# Patient Record
Sex: Male | Born: 1975 | Race: Black or African American | Hispanic: No | Marital: Single | Smoking: Never smoker
Health system: Southern US, Community
[De-identification: ages and names within clinical notes are randomized; demographics above are authoritative.]

## PROBLEM LIST (undated history)

## (undated) DIAGNOSIS — I10 Essential (primary) hypertension: Secondary | ICD-10-CM

## (undated) DIAGNOSIS — N289 Disorder of kidney and ureter, unspecified: Secondary | ICD-10-CM

## (undated) HISTORY — PX: KNEE ARTHROSCOPY: SUR90

---

## 2002-01-03 ENCOUNTER — Encounter: Payer: Self-pay | Admitting: *Deleted

## 2002-01-03 ENCOUNTER — Encounter: Admission: RE | Admit: 2002-01-03 | Discharge: 2002-01-03 | Payer: Self-pay | Admitting: *Deleted

## 2003-07-29 ENCOUNTER — Emergency Department (HOSPITAL_COMMUNITY): Admission: EM | Admit: 2003-07-29 | Discharge: 2003-07-29 | Payer: Self-pay | Admitting: Family Medicine

## 2004-03-12 ENCOUNTER — Observation Stay (HOSPITAL_COMMUNITY): Admission: AC | Admit: 2004-03-12 | Discharge: 2004-03-13 | Payer: Self-pay

## 2006-06-15 ENCOUNTER — Emergency Department (HOSPITAL_COMMUNITY): Admission: EM | Admit: 2006-06-15 | Discharge: 2006-06-15 | Payer: Self-pay | Admitting: Family Medicine

## 2008-07-05 ENCOUNTER — Emergency Department (HOSPITAL_COMMUNITY): Admission: EM | Admit: 2008-07-05 | Discharge: 2008-07-05 | Payer: Self-pay | Admitting: Emergency Medicine

## 2010-06-03 IMAGING — CR DG FINGER RING 2+V*L*
3 series · 3 of 3 positions shown · non-contrast
Comparison: None

CLINICAL DATA: Crush injury.

LEFT RING FINGER 2+V

[x finger pa left]
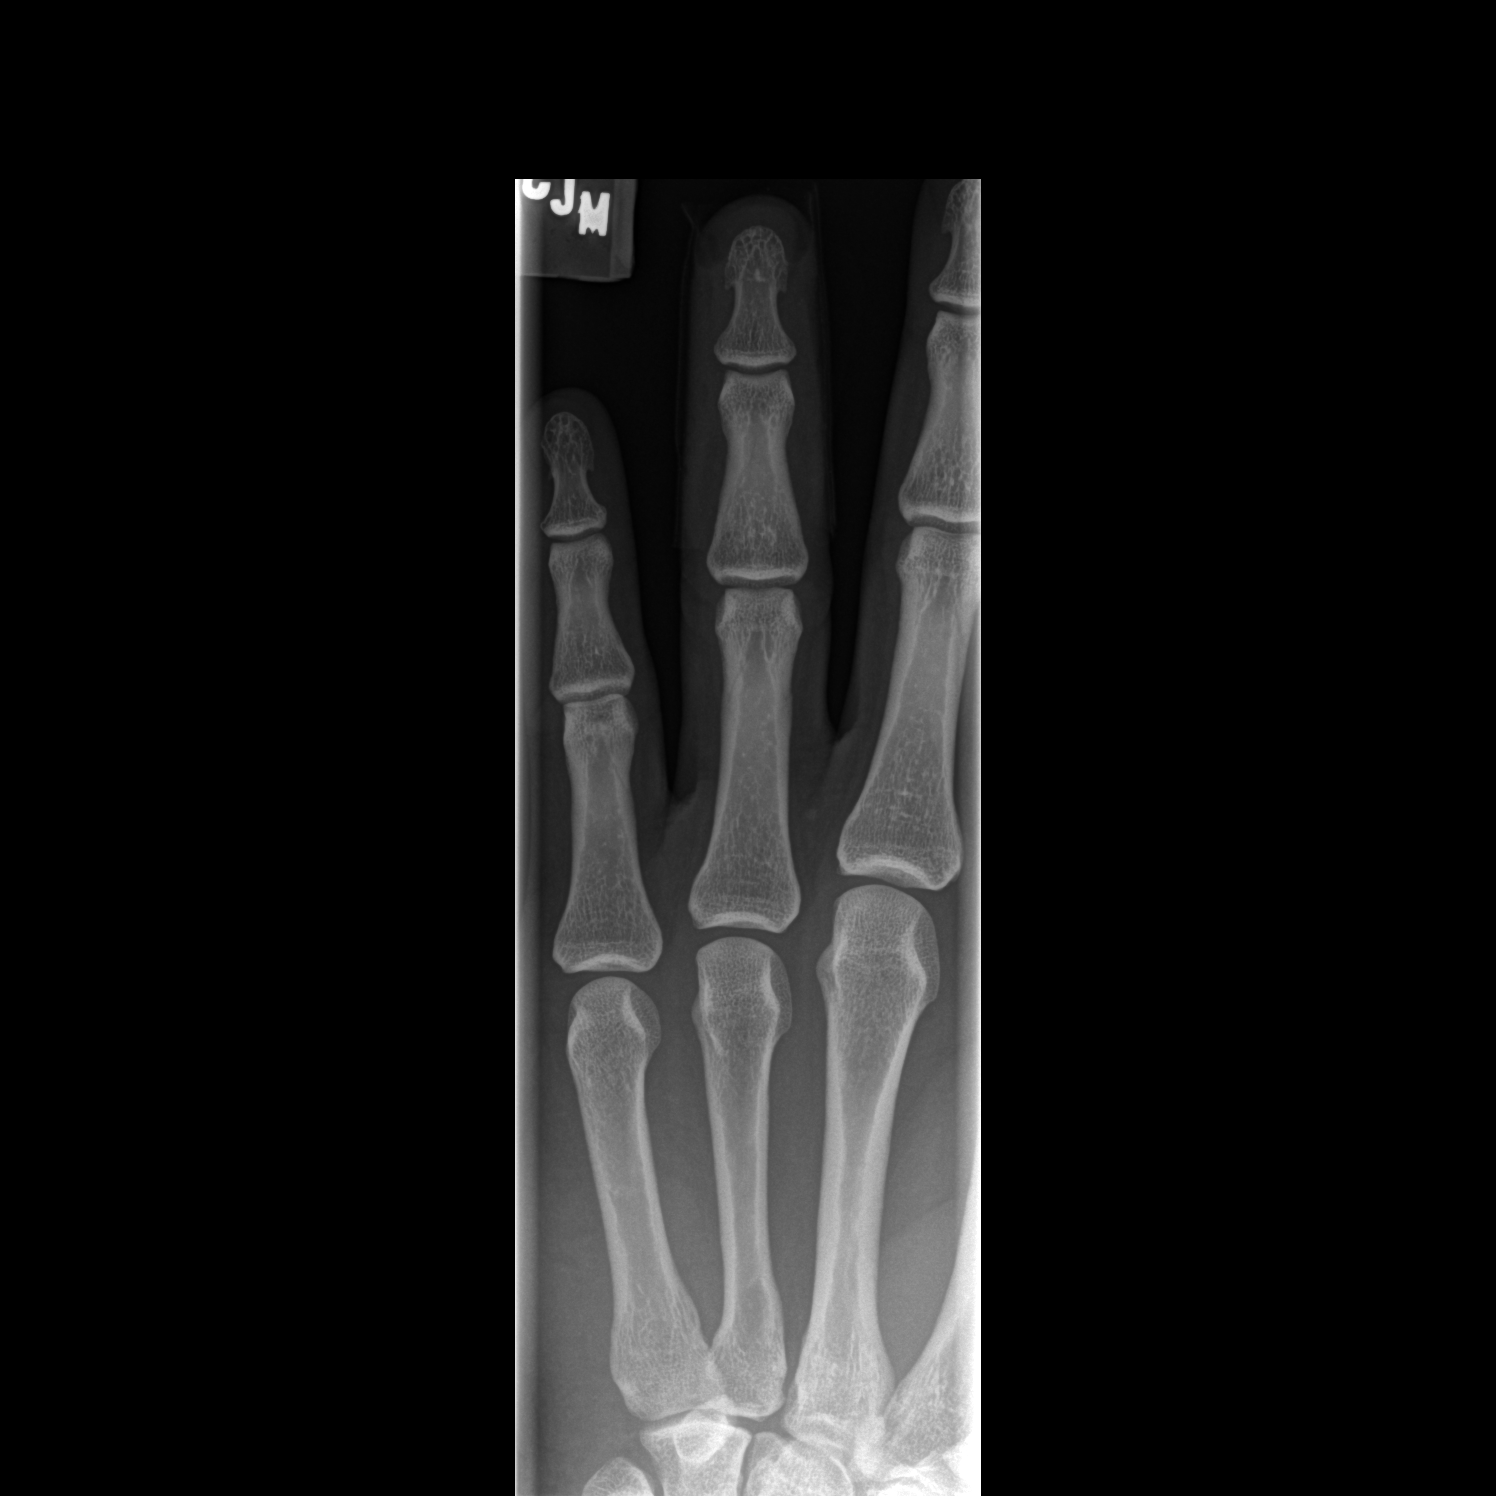

[x finger obl. left]
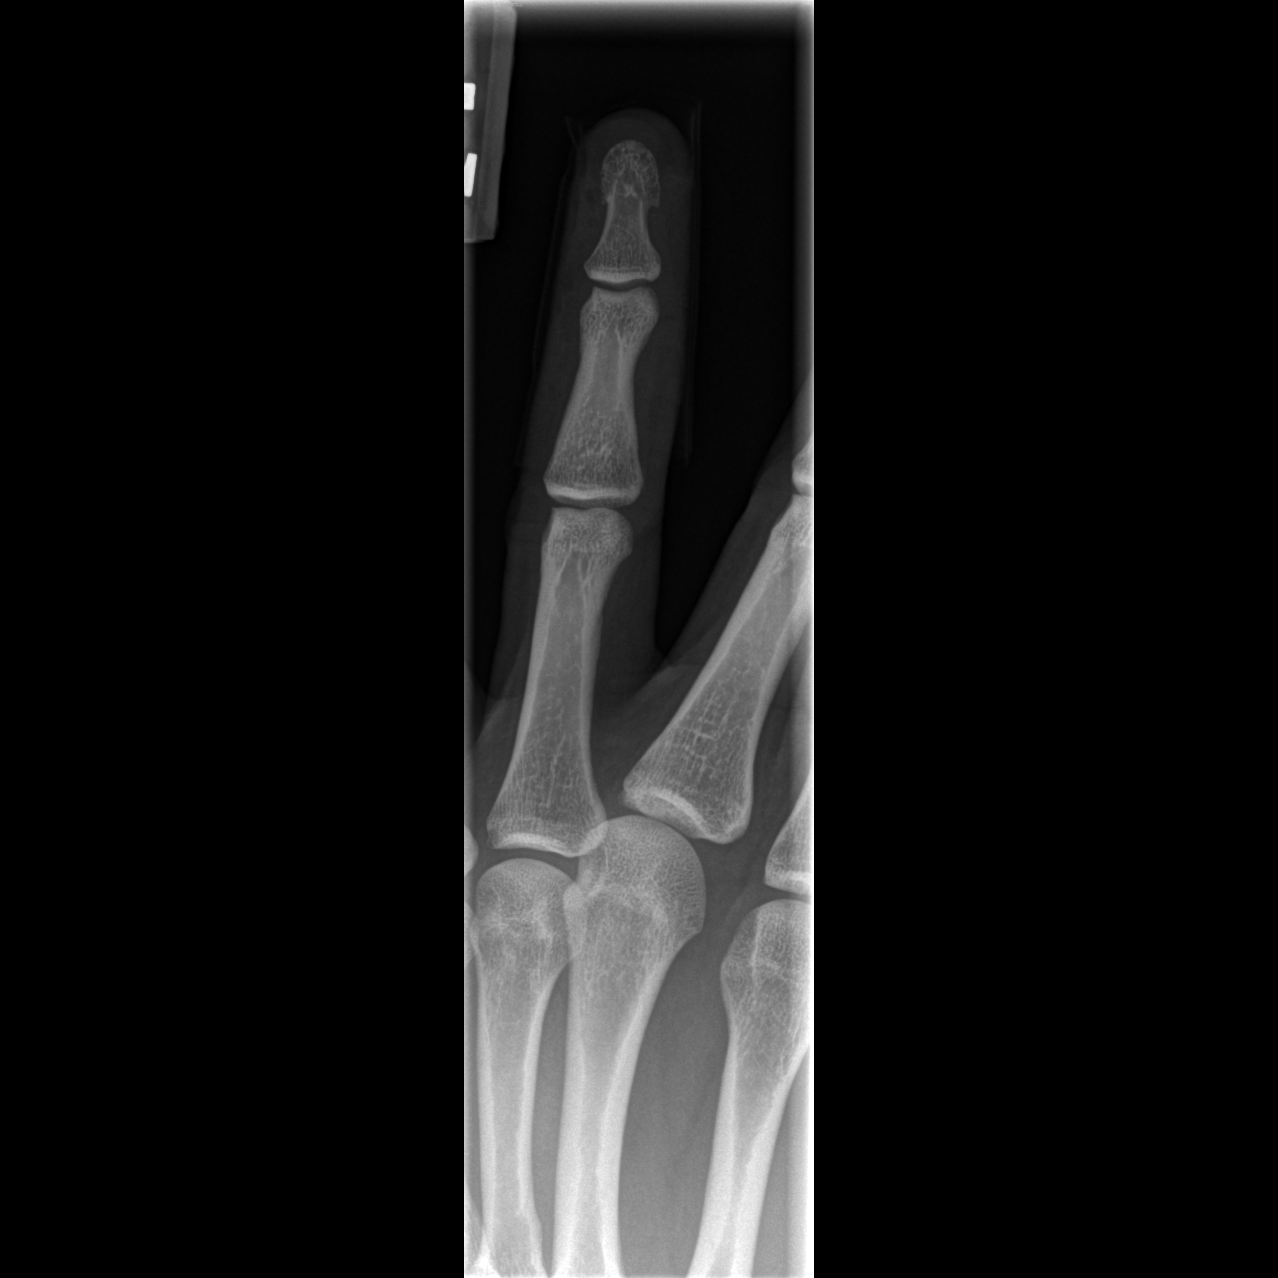

[x finger lateral left]
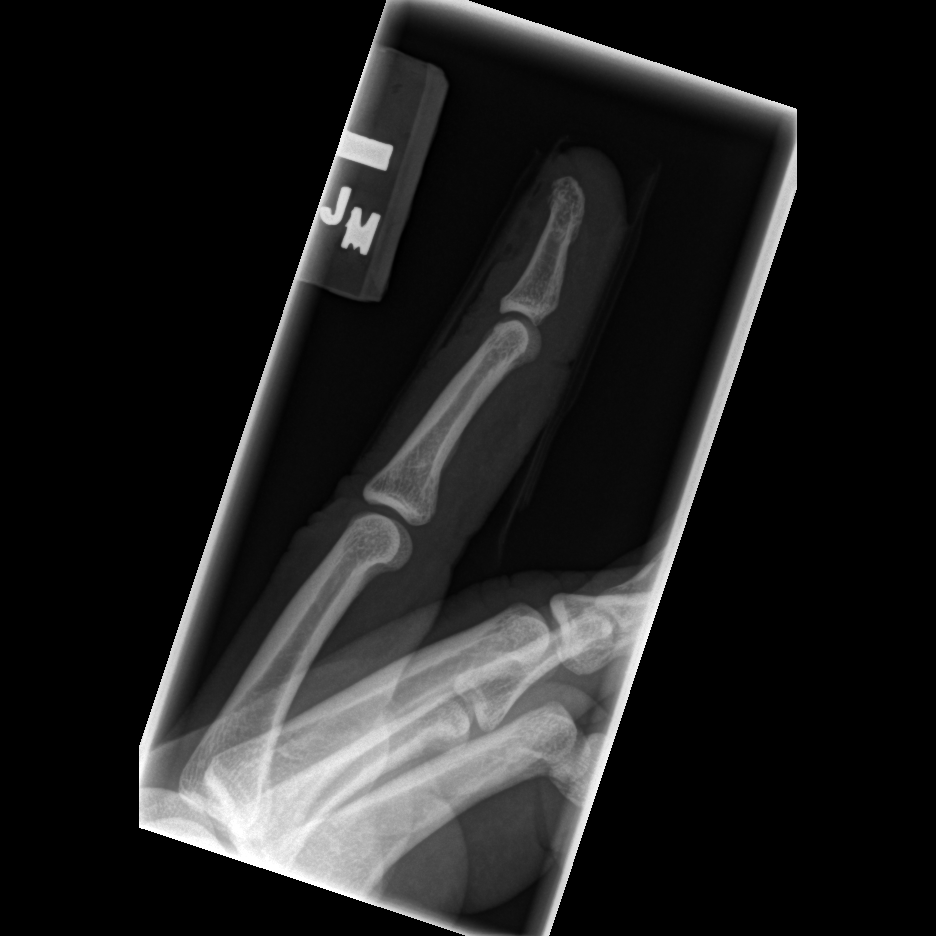

[3 of 3 positions shown; findings below may reference images not displayed]

FINDINGS: Negative for fracture or foreign body.  There is gas in
the soft tissues due to laceration.
IMPRESSION: Negative for fracture.
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
## 2015-10-15 ENCOUNTER — Encounter (HOSPITAL_COMMUNITY): Payer: Self-pay | Admitting: Adult Health

## 2015-10-15 DIAGNOSIS — R112 Nausea with vomiting, unspecified: Secondary | ICD-10-CM | POA: Diagnosis not present

## 2015-10-15 DIAGNOSIS — I10 Essential (primary) hypertension: Secondary | ICD-10-CM | POA: Diagnosis not present

## 2015-10-15 DIAGNOSIS — R42 Dizziness and giddiness: Secondary | ICD-10-CM | POA: Diagnosis not present

## 2015-10-15 DIAGNOSIS — Z79899 Other long term (current) drug therapy: Secondary | ICD-10-CM | POA: Insufficient documentation

## 2015-10-15 LAB — BASIC METABOLIC PANEL
ANION GAP: 13 (ref 5–15)
BUN: 9 mg/dL (ref 6–20)
CALCIUM: 9.3 mg/dL (ref 8.9–10.3)
CO2: 23 mmol/L (ref 22–32)
CREATININE: 1.4 mg/dL — AB (ref 0.61–1.24)
Chloride: 103 mmol/L (ref 101–111)
Glucose, Bld: 95 mg/dL (ref 65–99)
Potassium: 3.7 mmol/L (ref 3.5–5.1)
SODIUM: 139 mmol/L (ref 135–145)

## 2015-10-15 LAB — URINALYSIS, ROUTINE W REFLEX MICROSCOPIC
Bilirubin Urine: NEGATIVE
Glucose, UA: NEGATIVE mg/dL
HGB URINE DIPSTICK: NEGATIVE
Ketones, ur: NEGATIVE mg/dL
LEUKOCYTES UA: NEGATIVE
NITRITE: NEGATIVE
PROTEIN: NEGATIVE mg/dL
SPECIFIC GRAVITY, URINE: 1.017 (ref 1.005–1.030)
pH: 6 (ref 5.0–8.0)

## 2015-10-15 LAB — CBC
HCT: 43.4 % (ref 39.0–52.0)
HEMOGLOBIN: 14.8 g/dL (ref 13.0–17.0)
MCH: 27.6 pg (ref 26.0–34.0)
MCHC: 34.1 g/dL (ref 30.0–36.0)
MCV: 80.8 fL (ref 78.0–100.0)
PLATELETS: 338 10*3/uL (ref 150–400)
RBC: 5.37 MIL/uL (ref 4.22–5.81)
RDW: 14.2 % (ref 11.5–15.5)
WBC: 8.6 10*3/uL (ref 4.0–10.5)

## 2015-10-15 NOTE — ED Notes (Addendum)
Presents with episode of dizziness that began Thursday associated with nausea and emesis-"all of a sudden I feel weak and then I start to get dizzy. I try to get up and move but have to sit down because moving makes dizziness worse, then I vomit. Today at 8 pm began feeling light headed, denies dizziness now, endorses nausea. Denies pain. Pt is hypertensive. Denies headache.  Alert, oriented, denies numbness and tingling.

## 2015-10-16 ENCOUNTER — Emergency Department (HOSPITAL_COMMUNITY)
Admission: EM | Admit: 2015-10-16 | Discharge: 2015-10-16 | Disposition: A | Discharge status: Home / Self Care | Payer: 59 | Attending: Emergency Medicine | Admitting: Emergency Medicine

## 2015-10-16 DIAGNOSIS — R112 Nausea with vomiting, unspecified: Secondary | ICD-10-CM

## 2015-10-16 DIAGNOSIS — R42 Dizziness and giddiness: Secondary | ICD-10-CM

## 2015-10-16 HISTORY — DX: Essential (primary) hypertension: I10

## 2015-10-16 LAB — CBG MONITORING, ED
GLUCOSE-CAPILLARY: 116 mg/dL — AB (ref 65–99)
Glucose-Capillary: 103 mg/dL — ABNORMAL HIGH (ref 65–99)

## 2015-10-16 LAB — I-STAT TROPONIN, ED: TROPONIN I, POC: 0 ng/mL (ref 0.00–0.08)

## 2015-10-16 MED ORDER — ONDANSETRON 4 MG PO TBDP
8.0000 mg | ORAL_TABLET | Freq: Once | ORAL | Status: AC
  Administered 2015-10-16: 8 mg via ORAL
  Filled 2015-10-16: qty 2

## 2015-10-16 MED ORDER — ONDANSETRON 4 MG PO TBDP: 4.0000 mg | ORAL_TABLET | Freq: Three times a day (TID) | ORAL | Status: AC | PRN

## 2015-10-16 MED ORDER — MECLIZINE HCL 25 MG PO TABS: 25.0000 mg | ORAL_TABLET | Freq: Three times a day (TID) | ORAL | Status: AC | PRN

## 2015-10-16 NOTE — ED Provider Notes (Signed)
CSN: [id number]     Arrival date & time 10/15/15  2124 History  By signing my name below, I, Doreatha MartinEva Mathews, attest that this documentation has been prepared under the direction and in the presence of Laurence Spatesachel Morgan Little, MD. Electronically Signed: Doreatha MartinEva Mathews, ED Scribe. 10/16/2015. 2:27 AM.    Chief Complaint  Patient presents with  . Dizziness  . Nausea   The history is provided by the patient. No language interpreter was used.   HPI Comments: [name] is a 40 y.o. male with h/o HTN who presents to the Emergency Department complaining of two episodes of dizziness that began 5 days ago. Per pt, his first episode of "room spinning" dizziness occurred 5 days ago while seated, lasted 30 minutes, and was accompanied by nausea and emesis, lasting hours. Pt states his dizziness recurred yesterday afternoon after standing from bending over to clean and was also associated with nausea and emesis. He states he is currently asymptomatic aside from some mild residual nausea. No recent illnesses. Denies tinnitus, HA, visual disturbance, numbness, paresthesia, weakness, gait disturbance, syncope. He also denies recent cough, fever, congestion, abdominal pain.  PCP is Dr. Jillyn HiddenFulp with [facility].   Past Medical History  Diagnosis Date  . Hypertension    History reviewed. No pertinent past surgical history. History reviewed. No pertinent family history. Social History  Substance Use Topics  . Smoking status: Never Smoker   . Smokeless tobacco: None  . Alcohol Use: No    Review of Systems A complete 10 system review of systems was obtained and all systems are negative except as noted in the HPI and PMH.    Allergies  Review of patient's allergies indicates no known allergies.  Home Medications   Prior to Admission medications   Medication Sig Start Date End Date Taking? Authorizing Provider  amLODipine (NORVASC) 5 MG tablet Take 5 mg by mouth daily.   Yes Historical Provider, MD   hydrochlorothiazide (MICROZIDE) 12.5 MG capsule Take 12.5 mg by mouth daily.   Yes Historical Provider, MD   BP 161/112 mmHg  Pulse 70  Temp(Src) 98.8 F (37.1 C) (Oral)  Resp 16  Ht 5\' 10"  (1.778 m)  Wt 243 lb 4 oz (110.337 kg)  BMI 34.90 kg/m2  SpO2 98% Physical Exam  Constitutional: He is oriented to person, place, and time. He appears well-developed and well-nourished. No distress.  Awake, alert  HENT:  Head: Normocephalic and atraumatic.  Eyes: Conjunctivae and EOM are normal. Pupils are equal, round, and reactive to light.  Neck: Neck supple.  Cardiovascular: Normal rate, regular rhythm and normal heart sounds.   No murmur heard. Pulmonary/Chest: Effort normal and breath sounds normal. No respiratory distress.  Abdominal: Soft. Bowel sounds are normal. He exhibits no distension.  Musculoskeletal: He exhibits no edema.  Neurological: He is alert and oriented to person, place, and time. He has normal reflexes. No cranial nerve deficit. He exhibits normal muscle tone.  Fluent speech, normal finger-to-nose testing, negative pronator drift, no clonus 5/5 strength and normal sensation x all 4 extremities  Skin: Skin is warm and dry.  Psychiatric: He has a normal mood and affect. Judgment and thought content normal.  Nursing note and vitals reviewed.   ED Course  Procedures (including critical care time) DIAGNOSTIC STUDIES: Oxygen Saturation is 100% on RA, normal by my interpretation.    COORDINATION OF CARE: 2:19 AM Discussed treatment plan with pt at bedside which includes lab work, EKG and pt agreed to plan.  Labs Review Labs Reviewed  BASIC METABOLIC PANEL - Abnormal; Notable for the following:    Creatinine, Ser 1.40 (*)    All other components within normal limits  CBG MONITORING, ED - Abnormal; Notable for the following:    Glucose-Capillary 103 (*)    All other components within normal limits  CBG MONITORING, ED - Abnormal; Notable for the following:     Glucose-Capillary 116 (*)    All other components within normal limits  CBC  URINALYSIS, ROUTINE W REFLEX MICROSCOPIC (NOT AT [facility])  I-STAT TROPOININ, ED    I have personally reviewed and evaluated these lab results as part of my medical decision-making.   EKG Interpretation   Date/Time:  Monday Oct 15 2015 21:45:18 EDT Ventricular Rate:  82 PR Interval:  186 QRS Duration: 78 QT Interval:  362 QTC Calculation: 422 R Axis:   30 Text Interpretation:  Normal sinus rhythm Cannot rule out Anterior infarct  , age undetermined T wave abnormality, consider inferolateral ischemia  Abnormal ECG T wave inversions in inferior leads present on previous T  wave inversions in lateral leads new from previous EKG 10 years ago  Confirmed by LITTLE MD, RACHEL [phone]) on 10/16/2015 2:19:49 AM       Medications  ondansetron (ZOFRAN-ODT) disintegrating tablet 8 mg (8 mg Oral Given 10/16/15 0231)    2:23 AM- Will provide rx for Meclizine and Zofran. Advised f/u with PCP as needed, or with worsening symptoms. Home symptomatic therapy discussed, including Epley maneuvers and mindfulness with head ROM.    MDM   Final diagnoses:  Vertigo  Non-intractable vomiting with nausea, vomiting of unspecified type   Patient presents with 2 episodes of vertigo associated with nausea and vomiting that occurred a few days ago and then again this evening. On exam, the patient was awake and alert, well-appearing and in no acute distress. Vital signs notable for hypertension in the 160s systolic. Normal neurologic exam. The patient denied any dizziness or significant headache. EKG shows some T-wave inversions but no recent EKG available for comparison; last EKG was 10 years ago. 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 He denies CP/SOB. Given the patient's young age and normal neuro exam with no red flag symptoms such as headache, fever, visual disturbance, or balance  problems, I suspect a peripheral rather than central cause of vertigo. Discussed supportive care including meclizine and Epley maneuvers and provided with meclizine and zofran to use at home. Gave dose of Zofran in the ED after which patient felt improved and was able to tolerate liquids. Discussed f/u w/ PCP. Extensively reviewed return precautions including sudden onset HA, visual changes, focal weakness/numbness, or infectious symptoms. The patient voiced understanding and was discharged in satisfactory condition.  I personally performed the services described in this documentation, which was scribed in my presence. The recorded information has been reviewed and is accurate.   Laurence Spates, MD 10/16/15 [phone]

## 2015-10-16 NOTE — Discharge Instructions (Signed)

## 2016-07-10 DIAGNOSIS — G4733 Obstructive sleep apnea (adult) (pediatric): Secondary | ICD-10-CM | POA: Diagnosis not present

## 2016-08-10 DIAGNOSIS — G4733 Obstructive sleep apnea (adult) (pediatric): Secondary | ICD-10-CM | POA: Diagnosis not present

## 2016-10-07 DIAGNOSIS — I10 Essential (primary) hypertension: Secondary | ICD-10-CM | POA: Diagnosis not present

## 2016-10-07 DIAGNOSIS — Z79899 Other long term (current) drug therapy: Secondary | ICD-10-CM | POA: Diagnosis not present

## 2016-11-04 DIAGNOSIS — I10 Essential (primary) hypertension: Secondary | ICD-10-CM | POA: Diagnosis not present

## 2016-11-04 DIAGNOSIS — Z79899 Other long term (current) drug therapy: Secondary | ICD-10-CM | POA: Diagnosis not present

## 2016-11-04 DIAGNOSIS — N289 Disorder of kidney and ureter, unspecified: Secondary | ICD-10-CM | POA: Diagnosis not present

## 2016-11-26 DIAGNOSIS — E782 Mixed hyperlipidemia: Secondary | ICD-10-CM | POA: Diagnosis not present

## 2016-11-26 DIAGNOSIS — Z0000 Encounter for general adult medical examination without abnormal findings: Secondary | ICD-10-CM | POA: Diagnosis not present

## 2016-11-26 DIAGNOSIS — Z23 Encounter for immunization: Secondary | ICD-10-CM | POA: Diagnosis not present

## 2016-11-26 DIAGNOSIS — G4733 Obstructive sleep apnea (adult) (pediatric): Secondary | ICD-10-CM | POA: Diagnosis not present

## 2016-12-02 DIAGNOSIS — H0015 Chalazion left lower eyelid: Secondary | ICD-10-CM | POA: Diagnosis not present

## 2017-03-11 DIAGNOSIS — I10 Essential (primary) hypertension: Secondary | ICD-10-CM | POA: Diagnosis not present

## 2017-03-11 DIAGNOSIS — Z23 Encounter for immunization: Secondary | ICD-10-CM | POA: Diagnosis not present

## 2017-04-14 DIAGNOSIS — I10 Essential (primary) hypertension: Secondary | ICD-10-CM | POA: Diagnosis not present

## 2017-12-22 DIAGNOSIS — B354 Tinea corporis: Secondary | ICD-10-CM | POA: Diagnosis not present

## 2017-12-22 DIAGNOSIS — I10 Essential (primary) hypertension: Secondary | ICD-10-CM | POA: Diagnosis not present

## 2017-12-22 DIAGNOSIS — J301 Allergic rhinitis due to pollen: Secondary | ICD-10-CM | POA: Diagnosis not present

## 2018-03-12 DIAGNOSIS — M25561 Pain in right knee: Secondary | ICD-10-CM | POA: Diagnosis not present

## 2018-03-16 DIAGNOSIS — M25561 Pain in right knee: Secondary | ICD-10-CM | POA: Diagnosis not present

## 2018-03-18 DIAGNOSIS — R109 Unspecified abdominal pain: Secondary | ICD-10-CM | POA: Diagnosis not present

## 2018-03-18 DIAGNOSIS — I129 Hypertensive chronic kidney disease with stage 1 through stage 4 chronic kidney disease, or unspecified chronic kidney disease: Secondary | ICD-10-CM | POA: Diagnosis not present

## 2018-03-18 DIAGNOSIS — M25561 Pain in right knee: Secondary | ICD-10-CM | POA: Diagnosis not present

## 2018-03-18 DIAGNOSIS — N183 Chronic kidney disease, stage 3 (moderate): Secondary | ICD-10-CM | POA: Diagnosis not present

## 2018-03-18 DIAGNOSIS — N2581 Secondary hyperparathyroidism of renal origin: Secondary | ICD-10-CM | POA: Diagnosis not present

## 2018-03-29 ENCOUNTER — Other Ambulatory Visit: Payer: Self-pay | Admitting: Nephrology

## 2018-03-29 DIAGNOSIS — N183 Chronic kidney disease, stage 3 unspecified: Secondary | ICD-10-CM

## 2018-04-07 DIAGNOSIS — M1711 Unilateral primary osteoarthritis, right knee: Secondary | ICD-10-CM | POA: Diagnosis not present

## 2018-04-07 DIAGNOSIS — M948X6 Other specified disorders of cartilage, lower leg: Secondary | ICD-10-CM | POA: Diagnosis not present

## 2018-04-07 DIAGNOSIS — S83241A Other tear of medial meniscus, current injury, right knee, initial encounter: Secondary | ICD-10-CM | POA: Diagnosis not present

## 2018-04-07 DIAGNOSIS — G8918 Other acute postprocedural pain: Secondary | ICD-10-CM | POA: Diagnosis not present

## 2018-04-07 DIAGNOSIS — S83231A Complex tear of medial meniscus, current injury, right knee, initial encounter: Secondary | ICD-10-CM | POA: Diagnosis not present

## 2018-04-13 DIAGNOSIS — M25661 Stiffness of right knee, not elsewhere classified: Secondary | ICD-10-CM | POA: Diagnosis not present

## 2018-04-13 DIAGNOSIS — M25561 Pain in right knee: Secondary | ICD-10-CM | POA: Diagnosis not present

## 2018-04-13 DIAGNOSIS — M6281 Muscle weakness (generalized): Secondary | ICD-10-CM | POA: Diagnosis not present

## 2018-04-16 DIAGNOSIS — M25661 Stiffness of right knee, not elsewhere classified: Secondary | ICD-10-CM | POA: Diagnosis not present

## 2018-04-16 DIAGNOSIS — M25561 Pain in right knee: Secondary | ICD-10-CM | POA: Diagnosis not present

## 2018-04-16 DIAGNOSIS — M6281 Muscle weakness (generalized): Secondary | ICD-10-CM | POA: Diagnosis not present

## 2018-04-20 DIAGNOSIS — M6281 Muscle weakness (generalized): Secondary | ICD-10-CM | POA: Diagnosis not present

## 2018-04-20 DIAGNOSIS — M25561 Pain in right knee: Secondary | ICD-10-CM | POA: Diagnosis not present

## 2018-04-20 DIAGNOSIS — M25661 Stiffness of right knee, not elsewhere classified: Secondary | ICD-10-CM | POA: Diagnosis not present

## 2018-04-22 DIAGNOSIS — M25561 Pain in right knee: Secondary | ICD-10-CM | POA: Diagnosis not present

## 2018-04-22 DIAGNOSIS — M6281 Muscle weakness (generalized): Secondary | ICD-10-CM | POA: Diagnosis not present

## 2018-04-22 DIAGNOSIS — M25661 Stiffness of right knee, not elsewhere classified: Secondary | ICD-10-CM | POA: Diagnosis not present

## 2018-04-26 ENCOUNTER — Ambulatory Visit
Admission: RE | Admit: 2018-04-26 | Discharge: 2018-04-26 | Disposition: A | Discharge status: Home / Self Care | Payer: 59 | Source: Ambulatory Visit | Attending: Nephrology | Admitting: Nephrology

## 2018-04-26 DIAGNOSIS — N183 Chronic kidney disease, stage 3 unspecified: Secondary | ICD-10-CM

## 2018-04-28 DIAGNOSIS — M25561 Pain in right knee: Secondary | ICD-10-CM | POA: Diagnosis not present

## 2018-04-28 DIAGNOSIS — M6281 Muscle weakness (generalized): Secondary | ICD-10-CM | POA: Diagnosis not present

## 2018-04-28 DIAGNOSIS — R1032 Left lower quadrant pain: Secondary | ICD-10-CM | POA: Diagnosis not present

## 2018-04-28 DIAGNOSIS — Z23 Encounter for immunization: Secondary | ICD-10-CM | POA: Diagnosis not present

## 2018-04-28 DIAGNOSIS — M25661 Stiffness of right knee, not elsewhere classified: Secondary | ICD-10-CM | POA: Diagnosis not present

## 2018-04-30 DIAGNOSIS — M6281 Muscle weakness (generalized): Secondary | ICD-10-CM | POA: Diagnosis not present

## 2018-04-30 DIAGNOSIS — M25561 Pain in right knee: Secondary | ICD-10-CM | POA: Diagnosis not present

## 2018-04-30 DIAGNOSIS — M25661 Stiffness of right knee, not elsewhere classified: Secondary | ICD-10-CM | POA: Diagnosis not present

## 2018-05-03 ENCOUNTER — Other Ambulatory Visit: Payer: Self-pay

## 2018-05-03 ENCOUNTER — Emergency Department (HOSPITAL_COMMUNITY): Payer: 59

## 2018-05-03 ENCOUNTER — Emergency Department (HOSPITAL_COMMUNITY)
Admission: EM | Admit: 2018-05-03 | Discharge: 2018-05-03 | Disposition: A | Discharge status: Home / Self Care | Payer: 59 | Attending: Emergency Medicine | Admitting: Emergency Medicine

## 2018-05-03 ENCOUNTER — Encounter (HOSPITAL_COMMUNITY): Payer: Self-pay

## 2018-05-03 DIAGNOSIS — R109 Unspecified abdominal pain: Secondary | ICD-10-CM | POA: Insufficient documentation

## 2018-05-03 DIAGNOSIS — Z79899 Other long term (current) drug therapy: Secondary | ICD-10-CM | POA: Insufficient documentation

## 2018-05-03 DIAGNOSIS — I10 Essential (primary) hypertension: Secondary | ICD-10-CM | POA: Diagnosis not present

## 2018-05-03 DIAGNOSIS — K760 Fatty (change of) liver, not elsewhere classified: Secondary | ICD-10-CM | POA: Diagnosis not present

## 2018-05-03 HISTORY — DX: Disorder of kidney and ureter, unspecified: N28.9

## 2018-05-03 LAB — I-STAT CHEM 8, ED
BUN: 12 mg/dL (ref 6–20)
CALCIUM ION: 1.14 mmol/L — AB (ref 1.15–1.40)
CHLORIDE: 102 mmol/L (ref 98–111)
Creatinine, Ser: 1.4 mg/dL — ABNORMAL HIGH (ref 0.61–1.24)
GLUCOSE: 91 mg/dL (ref 70–99)
HCT: 47 % (ref 39.0–52.0)
Hemoglobin: 16 g/dL (ref 13.0–17.0)
POTASSIUM: 3.2 mmol/L — AB (ref 3.5–5.1)
Sodium: 139 mmol/L (ref 135–145)
TCO2: 27 mmol/L (ref 22–32)

## 2018-05-03 LAB — URINALYSIS, ROUTINE W REFLEX MICROSCOPIC
BILIRUBIN URINE: NEGATIVE
Glucose, UA: NEGATIVE mg/dL
HGB URINE DIPSTICK: NEGATIVE
Ketones, ur: NEGATIVE mg/dL
Leukocytes, UA: NEGATIVE
Nitrite: NEGATIVE
PH: 5 (ref 5.0–8.0)
Protein, ur: NEGATIVE mg/dL
Specific Gravity, Urine: 1.02 (ref 1.005–1.030)

## 2018-05-03 MED ORDER — MELOXICAM 7.5 MG PO TABS: 15.0000 mg | ORAL_TABLET | Freq: Every day | ORAL | 0 refills | Status: AC

## 2018-05-03 NOTE — ED Notes (Signed)
Got patient on the monitor call bell in reach

## 2018-05-03 NOTE — ED Provider Notes (Signed)
Medical screening examination/treatment/procedure(s) were conducted as a shared visit with non-physician practitioner(s) and myself.  I personally evaluated the patient during the encounter.  Clinical Impression:   Final diagnoses:  Flank pain   The patient is an otherwise healthy 42 year old male, he states he has had 2 months of left-sided flank and side pain, he feels it every day, it is intermittent and changes in location slightly but there is been no associated urinary symptoms bowel symptoms fevers or anything else.  He has had a normal renal ultrasound per his report and is supposed to have a CT scan but it is not been ordered up to this point.  On my exam he has no tenderness to palpation, no rashes over the sidewall.  Urinalysis clean without infection or hematuria and CT scan appears normal to me.  It will be read by radiology and I will share these results with the patient.  He appears to be stable for discharge.  To f/u in outpt setting.   Eber HongMiller, Daryana Whirley, MD 05/04/18 [phone]

## 2018-05-03 NOTE — ED Triage Notes (Signed)
Pt reports left sided flank pain for 2 months that has gradually gotten worse. Pt saw his nephrologist and had a renal US done with no significant results. Pt then went to PCP and was told he needed a CT scan but does not have one scheduled yet. Pain 8/10 burning feeling. Denies n/v

## 2018-05-03 NOTE — Discharge Instructions (Signed)
Your CT scan showed no abnormal findings causing pain You do have a finding of "hepatic steatosis" or fatty liver, this is not causing your pain and is a chronic finding that you do not need to worry about today, share this with your doctor, it is likely chronic over time.  Please avoid alcohol, minimize the use of Tylenol Take Mobic twice a day for the next 2 weeks as needed for pain Follow-up with your doctor in 1 week Emergency department for increasing pain fever or vomiting

## 2018-05-03 NOTE — ED Provider Notes (Signed)
[facility] EMERGENCY DEPARTMENT Provider Note   CSN: [id number] Arrival date & time: 05/03/18  1431     History   Chief Complaint Chief Complaint  Patient presents with  . Flank Pain    HPI [name] is a 42 y.o. male who presents to the ED with left flank pain. The pain started 2 months ago and has gotten worse. Patient reports seeing his nephrologist and had a renal ultrasound 04/26/18 that was normal. Patient reports recently seeing his PCP and was told he needs an abdominal CT but is has not been scheduled. Patient came here today due to increased pain.   HPI  Past Medical History:  Diagnosis Date  . Hypertension   . Renal disorder     There are no active problems to display for this patient.   Past Surgical History:  Procedure Laterality Date  . KNEE ARTHROSCOPY Right         Home Medications    Prior to Admission medications   Medication Sig Start Date End Date Taking? Authorizing Provider  amLODipine (NORVASC) 5 MG tablet Take 5 mg by mouth daily.    [provider]  hydrochlorothiazide (MICROZIDE) 12.5 MG capsule Take 12.5 mg by mouth daily.    [provider]  meclizine (ANTIVERT) 25 MG tablet Take 1 tablet (25 mg total) by mouth 3 (three) times daily as needed for dizziness. 10/16/15   Little, Ambrose Finland, MD  ondansetron (ZOFRAN ODT) 4 MG disintegrating tablet Take 1 tablet (4 mg total) by mouth every 8 (eight) hours as needed for nausea or vomiting. 10/16/15   Little, Ambrose Finland, MD    Family History No family history on file.  Social History Social History   Tobacco Use  . Smoking status: Never Smoker  Substance Use Topics  . Alcohol use: No  . Drug use: No     Allergies   Patient has no known allergies.   Review of Systems Review of Systems  Gastrointestinal: Positive for abdominal pain.  Musculoskeletal: Positive for back pain.  All other systems reviewed and are  negative.    Physical Exam Updated Vital Signs BP (!) 164/119 (BP Location: Right Arm)   Pulse (!) 105   Temp 99.2 F (37.3 C) (Oral)   Resp 18   Ht 5\' 9"  (1.753 m)   Wt 109.8 kg   SpO2 99%   BMI 35.74 kg/m   Physical Exam  Constitutional: He appears well-developed and well-nourished. No distress.  HENT:  Head: Normocephalic.  Eyes: Conjunctivae and EOM are normal.  Neck: Neck supple.  Cardiovascular: Normal rate and regular rhythm.  Pulmonary/Chest: Effort normal.  Abdominal: Soft. Bowel sounds are normal. There is no tenderness.  Unable to reproduce the pain that patient has been having in his abdomen and flank area.   Musculoskeletal: Normal range of motion.  Neurological: He is alert.  Skin: Skin is warm and dry.  Psychiatric: He has a normal mood and affect. His behavior is normal.  Nursing note and vitals reviewed. patient reports that his pain feel deeper inside and does not change with movement or palpation.    ED Treatments / Results  Labs (all labs ordered are listed, but only abnormal results are displayed) Labs Reviewed  I-STAT CHEM 8, ED - Abnormal; Notable for the following components:      Result Value   Potassium 3.2 (*)    Creatinine, Ser 1.40 (*)    Calcium, Ion 1.14 (*)  All other components within normal limits  URINALYSIS, ROUTINE W REFLEX MICROSCOPIC   Radiology No results found.  Procedures Procedures (including critical care time)  Medications Ordered in ED Medications - No data to display   Initial Impression / Assessment and Plan / ED Course  I have reviewed the triage vital signs and the nursing notes.   Final Clinical Impressions(s) / ED Diagnoses  Patient awaiting labs and CT scan. Care turned over to Langston MaskerKaren Sofia, [facility] and Dr. Hyacinth MeekerMiller @ 4 pm.   ED Discharge Orders    None       Kerrie Buffaloeese,  [place], [place] 05/04/18 1119    Eber HongMiller, Brian, MD 05/04/18 1209

## 2018-05-04 ENCOUNTER — Other Ambulatory Visit: Payer: Self-pay | Admitting: Family Medicine

## 2018-05-04 DIAGNOSIS — R109 Unspecified abdominal pain: Secondary | ICD-10-CM

## 2018-05-09 DIAGNOSIS — R109 Unspecified abdominal pain: Secondary | ICD-10-CM | POA: Diagnosis not present

## 2018-05-21 DIAGNOSIS — N183 Chronic kidney disease, stage 3 (moderate): Secondary | ICD-10-CM | POA: Diagnosis not present

## 2018-05-21 DIAGNOSIS — R109 Unspecified abdominal pain: Secondary | ICD-10-CM | POA: Diagnosis not present

## 2018-05-21 DIAGNOSIS — I10 Essential (primary) hypertension: Secondary | ICD-10-CM | POA: Diagnosis not present

## 2018-07-25 ENCOUNTER — Other Ambulatory Visit: Payer: Self-pay | Admitting: Emergency Medicine

## 2018-08-03 DIAGNOSIS — R1084 Generalized abdominal pain: Secondary | ICD-10-CM | POA: Diagnosis not present

## 2018-08-13 DIAGNOSIS — N189 Chronic kidney disease, unspecified: Secondary | ICD-10-CM | POA: Diagnosis not present

## 2018-08-13 DIAGNOSIS — Z125 Encounter for screening for malignant neoplasm of prostate: Secondary | ICD-10-CM | POA: Diagnosis not present

## 2018-08-13 DIAGNOSIS — I129 Hypertensive chronic kidney disease with stage 1 through stage 4 chronic kidney disease, or unspecified chronic kidney disease: Secondary | ICD-10-CM | POA: Diagnosis not present

## 2018-10-18 DIAGNOSIS — M109 Gout, unspecified: Secondary | ICD-10-CM | POA: Diagnosis not present

## 2018-10-19 DIAGNOSIS — M25572 Pain in left ankle and joints of left foot: Secondary | ICD-10-CM | POA: Diagnosis not present

## 2018-10-19 DIAGNOSIS — M79672 Pain in left foot: Secondary | ICD-10-CM | POA: Diagnosis not present

## 2019-04-02 ENCOUNTER — Other Ambulatory Visit: Payer: Self-pay

## 2019-04-02 DIAGNOSIS — Z20822 Contact with and (suspected) exposure to covid-19: Secondary | ICD-10-CM

## 2019-04-03 LAB — NOVEL CORONAVIRUS, NAA: SARS-CoV-2, NAA: DETECTED — AB

## 2019-04-06 ENCOUNTER — Telehealth: Payer: Self-pay | Admitting: Internal Medicine

## 2019-04-06 NOTE — Telephone Encounter (Signed)
Patient called for follow-up of COVID-19 positive.  Patient said his symptoms have significantly improved.  Only concern is that his wife who initially tested negative is now complaining of cough and some body aches. He is self isolating and has not been in contact with wife since positive result.  Patient was advised to send his wife for testing at [facility] or any of the mobile testing sites.  All the questions were answered and concerns addressed.  Patient verbalized understanding.  Angelica Chessman, MD  04/06/19 9:38 AM

## 2020-05-15 IMAGING — US US RENAL
1 series · 14 of 25 positions shown · non-contrast
Comparison: None.

CLINICAL DATA: Stage III chronic kidney disease

EXAM:
RENAL / URINARY TRACT ULTRASOUND COMPLETE

[Series 1: us renal · 0.28mm/px · 14 of 28 slices shown]
[im 1/28]
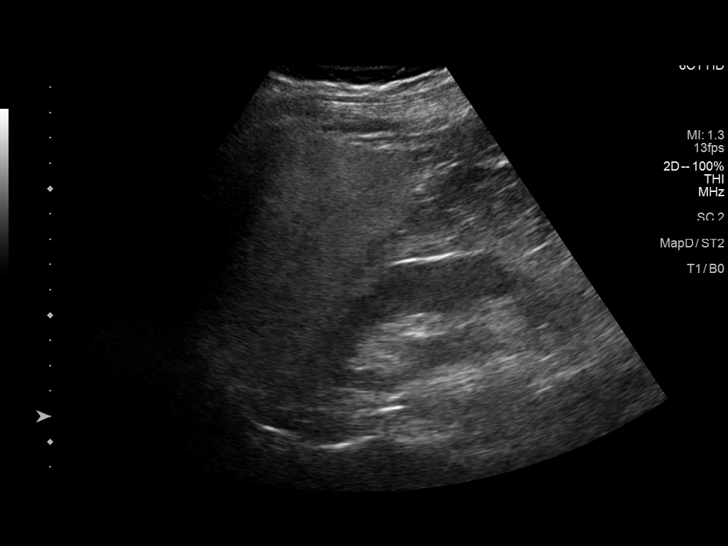
[im 3/28]
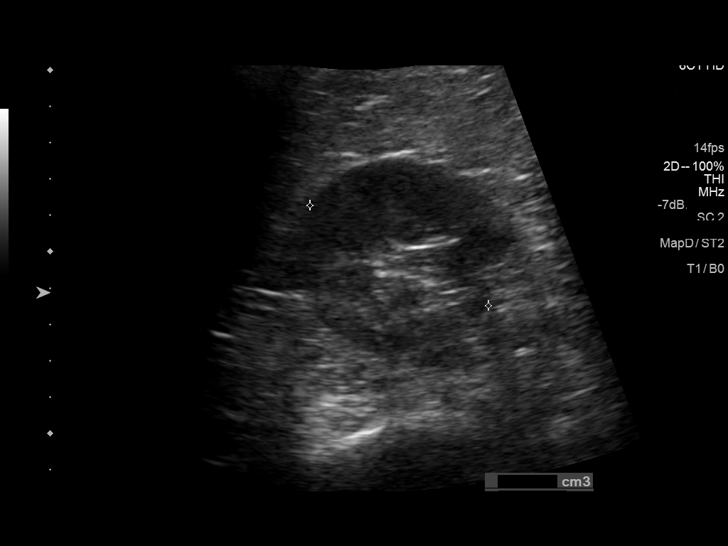
[im 5/28]
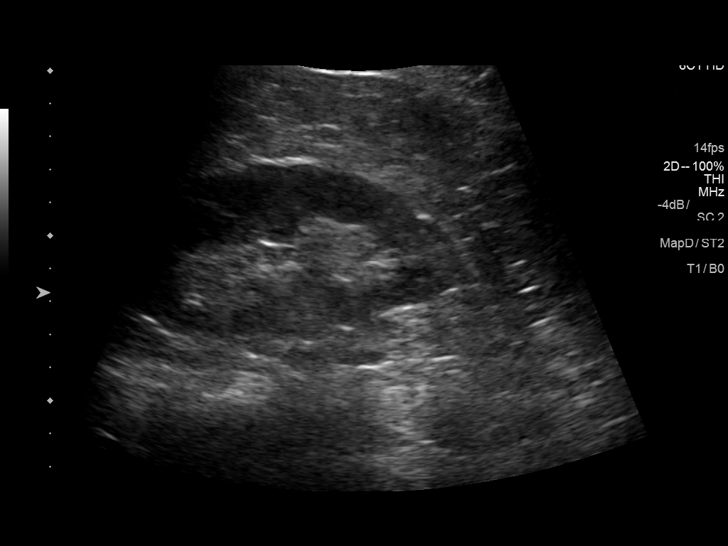
[im 7/28]
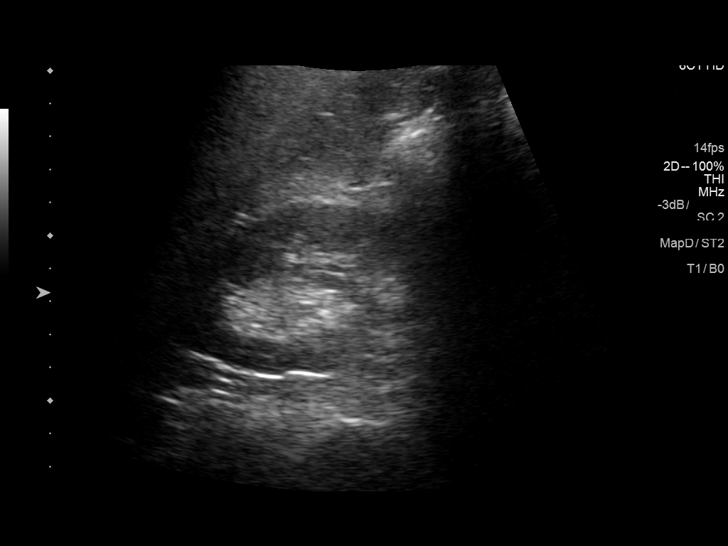
[im 10/28]
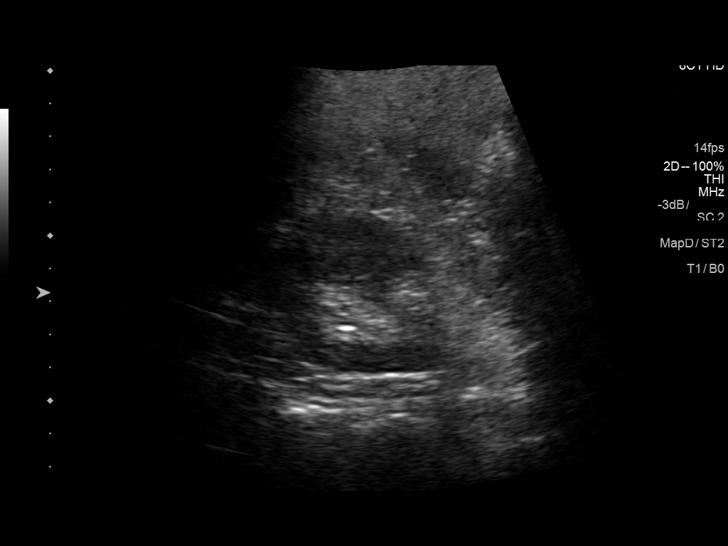
[im 11/28]
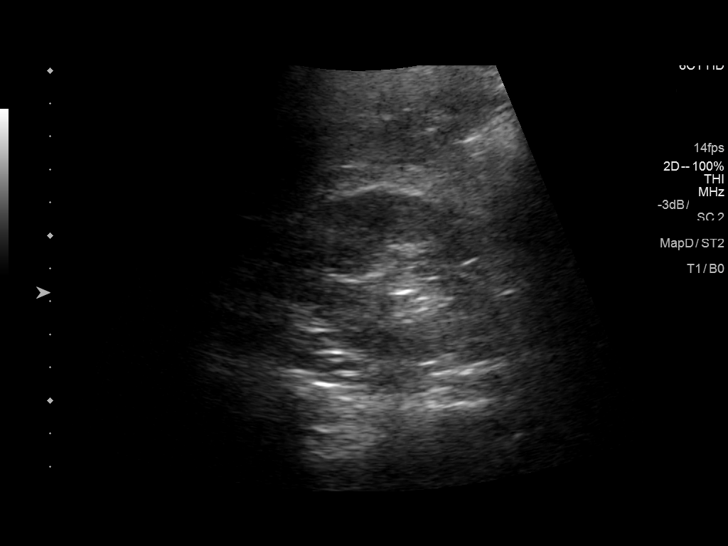
[im 13/28]
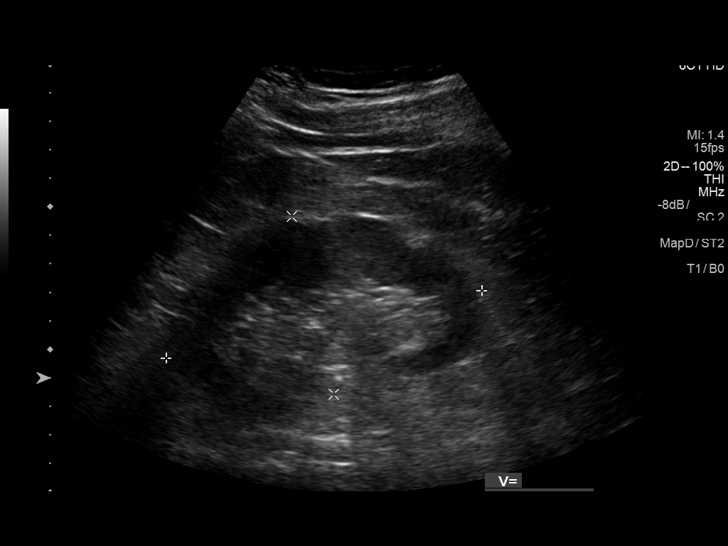
[im 15/28]
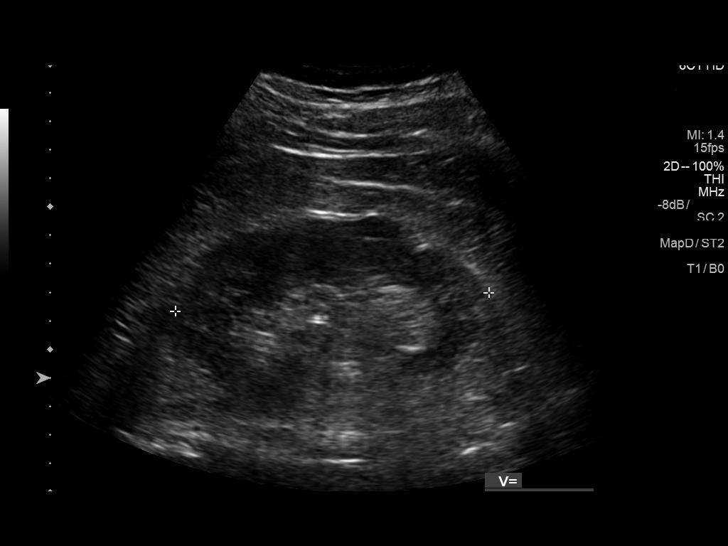
[im 17/28]
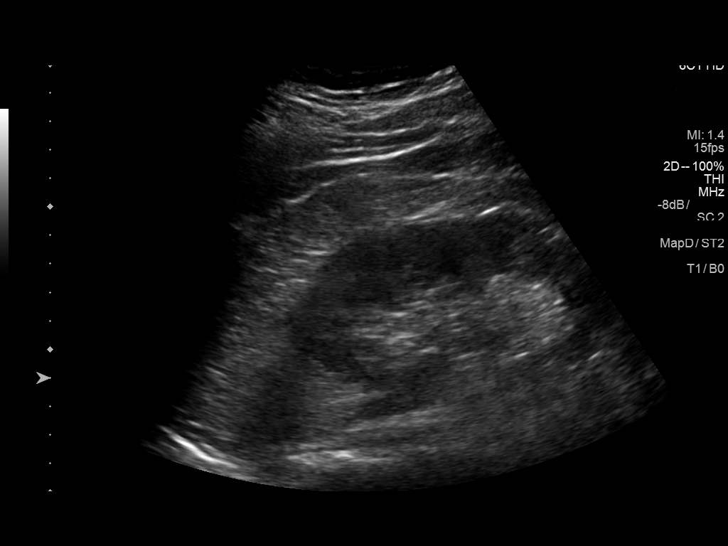
[im 19/28]
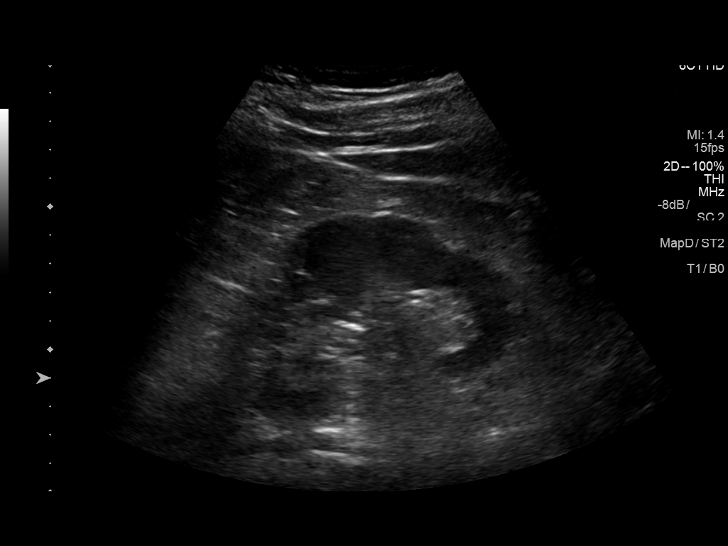
[im 21/28]
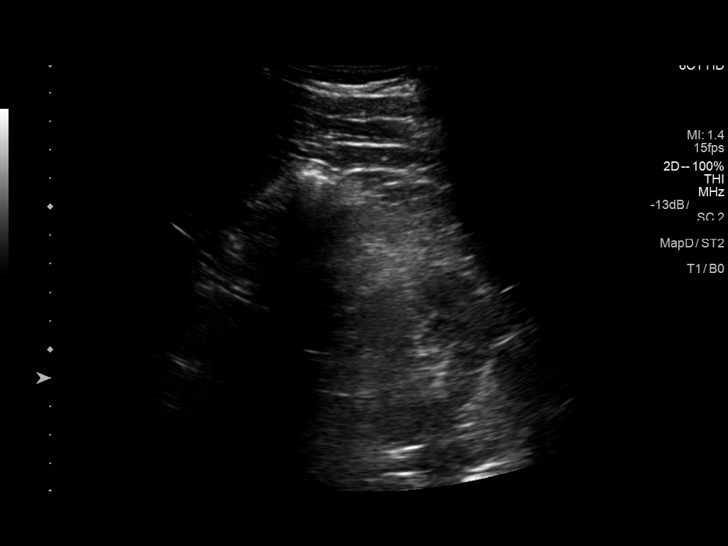
[im 23/28]
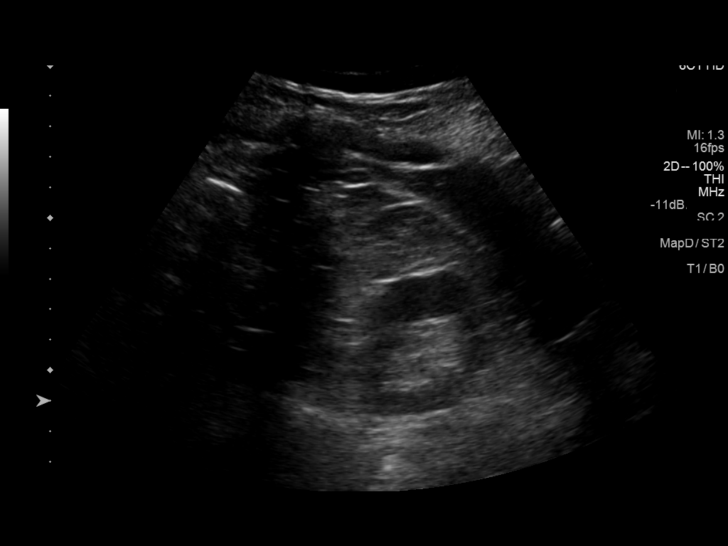
[im 25/28]
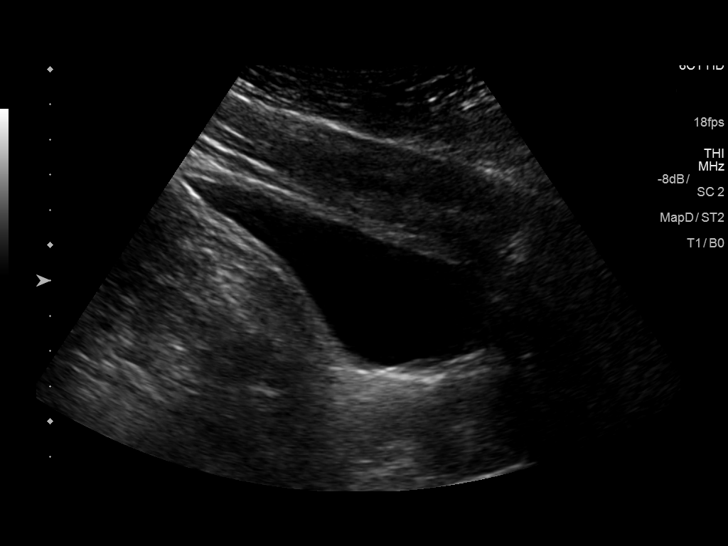
[im 28/28]
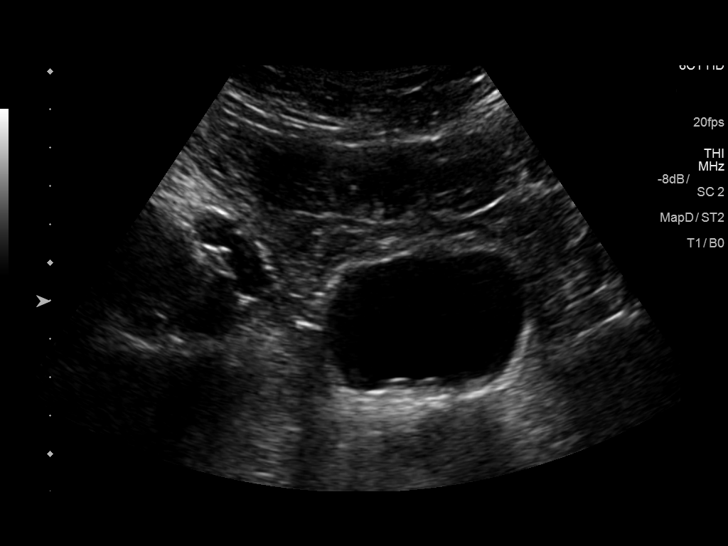

[14 of 25 positions shown; findings below may reference images not displayed]

FINDINGS: Right Kidney:

Renal measurements: 11.1 x 4.4 x 5.6 cm = volume: 145 mL . Normal
cortical thickness and echogenicity. No mass, hydronephrosis or
shadowing calcification.

Left Kidney:

Renal measurements: 11.3 x 6.4 x 5.3 cm = volume: 199 mL. Normal
cortical thickness and echogenicity. No mass, hydronephrosis or
shadowing calcification.

Bladder:

Appears normal for degree of bladder distention.
IMPRESSION: Normal renal ultrasound.

## 2021-10-22 ENCOUNTER — Other Ambulatory Visit: Payer: Self-pay | Admitting: Nephrology

## 2021-10-22 DIAGNOSIS — R10A2 Flank pain, left side: Secondary | ICD-10-CM

## 2021-10-22 DIAGNOSIS — E66811 Obesity, class 1: Secondary | ICD-10-CM

## 2021-10-22 DIAGNOSIS — N1831 Chronic kidney disease, stage 3a: Secondary | ICD-10-CM

## 2021-10-22 DIAGNOSIS — N2581 Secondary hyperparathyroidism of renal origin: Secondary | ICD-10-CM

## 2021-10-22 DIAGNOSIS — E669 Obesity, unspecified: Secondary | ICD-10-CM

## 2021-10-22 DIAGNOSIS — I129 Hypertensive chronic kidney disease with stage 1 through stage 4 chronic kidney disease, or unspecified chronic kidney disease: Secondary | ICD-10-CM

## 2021-10-22 DIAGNOSIS — R109 Unspecified abdominal pain: Secondary | ICD-10-CM

## 2021-10-23 ENCOUNTER — Ambulatory Visit
Admission: RE | Admit: 2021-10-23 | Discharge: 2021-10-23 | Disposition: A | Discharge status: Home / Self Care | Payer: 59 | Source: Ambulatory Visit | Attending: Nephrology | Admitting: Nephrology

## 2021-10-23 DIAGNOSIS — I129 Hypertensive chronic kidney disease with stage 1 through stage 4 chronic kidney disease, or unspecified chronic kidney disease: Secondary | ICD-10-CM

## 2021-10-23 DIAGNOSIS — N2581 Secondary hyperparathyroidism of renal origin: Secondary | ICD-10-CM

## 2021-10-23 DIAGNOSIS — R109 Unspecified abdominal pain: Secondary | ICD-10-CM

## 2021-10-23 DIAGNOSIS — E669 Obesity, unspecified: Secondary | ICD-10-CM

## 2021-10-23 DIAGNOSIS — N1831 Chronic kidney disease, stage 3a: Secondary | ICD-10-CM
